# Patient Record
Sex: Male | Born: 1995 | Race: White | Hispanic: No | Marital: Single | Smoking: Never smoker
Health system: Southern US, Community
[De-identification: ages and names within clinical notes are randomized; demographics above are authoritative.]

## PROBLEM LIST (undated history)

## (undated) DIAGNOSIS — R519 Headache, unspecified: Secondary | ICD-10-CM

## (undated) DIAGNOSIS — R51 Headache: Secondary | ICD-10-CM

## (undated) HISTORY — PX: TYMPANOSTOMY TUBE PLACEMENT: SHX32

---
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
## 2018-03-04 NOTE — Discharge Instructions (Signed)
T & A INSTRUCTION SHEET - [facility] °[facility] ° °CREIGHTON VAUGHT, MD °PAUL H. JUENGEL, MD  °P. SCOTT BENNETT °CHAPMAN MCQUEEN, MD ° °[address] [place], [place] [address] TEL. [phone] °[address] [place] [address] [phone] ° °INFORMATION SHEET FOR A TONSILLECTOMY AND ADENDOIDECTOMY ° °About Your Tonsils and Adenoids ° The tonsils and adenoids are normal body tissues that are part of our immune system.  They normally help to protect us against diseases that may enter our mouth and nose.  However, sometimes the tonsils and/or adenoids become too large and obstruct our breathing, especially at night. °  ° If either of these things happen it helps to remove the tonsils and adenoids in order to become healthier. The operation to remove the tonsils and adenoids is called a tonsillectomy and adenoidectomy. ° °The Location of Your Tonsils and Adenoids ° The tonsils are located in the back of the throat on both side and sit in a cradle of muscles. The adenoids are located in the roof of the mouth, behind the nose, and closely associated with the opening of the Eustachian tube to the ear. ° °Surgery on Tonsils and Adenoids ° A tonsillectomy and adenoidectomy is a short operation which takes about thirty minutes.  This includes being put to sleep and being awakened.  Tonsillectomies and adenoidectomies are performed at [facility] and may require observation period in the recovery room prior to going home. ° °Following the Operation for a Tonsillectomy ° A cautery machine is used to control bleeding.  Bleeding from a tonsillectomy and adenoidectomy is minimal and postoperatively the risk of bleeding is approximately four percent, although this rarely life threatening. ° ° ° °After your tonsillectomy and adenoidectomy post-op care at home: ° °1. Our patients are able to go home the same day.  You may be given prescriptions for pain  medications and antibiotics, if indicated. °2. It is extremely important to remember that fluid intake is of utmost importance after a tonsillectomy.  The amount that you drink must be maintained in the postoperative period.  A good indication of whether a child is getting enough fluid is whether his/her urine output is constant.  As long as children are urinating or wetting their diaper every 6 - 8 hours this is usually enough fluid intake.   °3. Although rare, this is a risk of some bleeding in the first ten days after surgery.  This is usually occurs between day five and nine postoperatively.  This risk of bleeding is approximately four percent.  If you or your child should have any bleeding you should remain calm and notify our office or go directly to the Emergency Room at [facility] where they will contact us. Our doctors are available seven days a week for notification.  We recommend sitting up quietly in a chair, place an ice pack on the front of the neck and spitting out the blood gently until we are able to contact you.  Adults should gargle gently with ice water and this may help stop the bleeding.  If the bleeding does not stop after a short time, i.e. 10 to 15 minutes, or seems to be increasing again, please contact us or go to the hospital.   °4. It is common for the pain to be worse at 5 - 7 days postoperatively.  This occurs because the “scab” is peeling off and the mucous membrane (skin of   the throat) is growing back where the tonsils were.   °5. It is common for a low-grade fever, less than 102, during the first week after a tonsillectomy and adenoidectomy.  It is usually due to not drinking enough liquids, and we suggest your use liquid Tylenol or the pain medicine with Tylenol prescribed in order to keep your temperature below 102.  Please follow the directions on the back of the bottle. °6. Do not take aspirin or any products that contain aspirin such as Bufferin, Anacin,  Ecotrin, aspirin gum, Goodies, BC headache powders, etc., after a T&A because it can promote bleeding.  Please check with our office before administering any other medication that may been prescribed by other doctors during the two week post-operative period. °7. If you happen to look in the mirror or into your child’s mouth you will see white/gray patches on the back of the throat.  This is what a scab looks like in the mouth and is normal after having a T&A.  It will disappear once the tonsil area heals completely. However, it may cause a noticeable odor, and this too will disappear with time.     °8. You or your child may experience ear pain after having a T&A.  This is called referred pain and comes from the throat, but it is felt in the ears.  Ear pain is quite common and expected.  It will usually go away after ten days.  There is usually nothing wrong with the ears, and it is primarily due to the healing area stimulating the nerve to the ear that runs along the side of the throat.  Use either the prescribed pain medicine or Tylenol as needed.  °9. The throat tissues after a tonsillectomy are obviously sensitive.  Smoking around children who have had a tonsillectomy significantly increases the risk of bleeding.  DO NOT SMOKE!  ° °General Anesthesia, Adult, Care After °These instructions provide you with information about caring for yourself after your procedure. Your health care provider may also give you more specific instructions. Your treatment has been planned according to current medical practices, but problems sometimes occur. Call your health care provider if you have any problems or questions after your procedure. °What can I expect after the procedure? °After the procedure, it is common to have: °· Vomiting. °· A sore throat. °· Mental slowness. ° °It is common to feel: °· Nauseous. °· Cold or shivery. °· Sleepy. °· Tired. °· Sore or achy, even in parts of your body where you did not have  surgery. ° °Follow these instructions at home: °For at least 24 hours after the procedure: °· Do not: °? Participate in activities where you could fall or become injured. °? Drive. °? Use heavy machinery. °? Drink alcohol. °? Take sleeping pills or medicines that cause drowsiness. °? Make important decisions or sign legal documents. °? Take care of children on your own. °· Rest. °Eating and drinking °· If you vomit, drink water, juice, or soup when you can drink without vomiting. °· Drink enough fluid to keep your urine clear or pale yellow. °· Make sure you have little or no nausea before eating solid foods. °· Follow the diet recommended by your health care provider. °General instructions °· Have a responsible adult stay with you until you are awake and alert. °· Return to your normal activities as told by your health care provider. Ask your health care provider what activities are safe for you. °· Take over-the-counter and   prescription medicines only as told by your health care provider. °· If you smoke, do not smoke without supervision. °· Keep all follow-up visits as told by your health care provider. This is important. °Contact a health care provider if: °· You continue to have nausea or vomiting at home, and medicines are not helpful. °· You cannot drink fluids or start eating again. °· You cannot urinate after 8-12 hours. °· You develop a skin rash. °· You have fever. °· You have increasing redness at the site of your procedure. °Get help right away if: °· You have difficulty breathing. °· You have chest pain. °· You have unexpected bleeding. °· You feel that you are having a life-threatening or urgent problem. °This information is not intended to replace advice given to you by your health care provider. Make sure you discuss any questions you have with your health care provider. °Document Released: 01/27/2001 Document Revised: 03/25/2016 Document Reviewed: 10/05/2015 °Elsevier Interactive Patient Education  © 2018 [employer] ° °

## 2018-03-06 ENCOUNTER — Ambulatory Visit: Payer: Managed Care, Other (non HMO) | Admitting: Anesthesiology

## 2018-03-06 ENCOUNTER — Encounter
Admission: RE | Disposition: A | Discharge status: Home / Self Care | Payer: Self-pay | Source: Ambulatory Visit | Attending: Unknown Physician Specialty

## 2018-03-06 ENCOUNTER — Ambulatory Visit
Admission: RE | Admit: 2018-03-06 | Discharge: 2018-03-06 | Disposition: A | Discharge status: Home / Self Care | Payer: Managed Care, Other (non HMO) | Source: Ambulatory Visit | Attending: Unknown Physician Specialty | Admitting: Unknown Physician Specialty

## 2018-03-06 DIAGNOSIS — J3501 Chronic tonsillitis: Secondary | ICD-10-CM | POA: Insufficient documentation

## 2018-03-06 HISTORY — PX: TONSILLECTOMY AND ADENOIDECTOMY: SHX28

## 2018-03-06 HISTORY — DX: Headache, unspecified: R51.9

## 2018-03-06 HISTORY — DX: Headache: R51

## 2018-03-06 SURGERY — TONSILLECTOMY AND ADENOIDECTOMY
Anesthesia: General | Site: Throat | Laterality: Bilateral | Wound class: "Clean Contaminated "

## 2018-03-06 MED ORDER — ACETAMINOPHEN 10 MG/ML IV SOLN
1000.0000 mg | Freq: Once | INTRAVENOUS | Status: AC
  Administered 2018-03-06: 1000 mg via INTRAVENOUS

## 2018-03-06 MED ORDER — MEPERIDINE HCL 25 MG/ML IJ SOLN: 6.2500 mg | INTRAMUSCULAR | Status: DC | PRN

## 2018-03-06 MED ORDER — HYDROCODONE-ACETAMINOPHEN 7.5-325 MG/15ML PO SOLN: 15.0000 mL | Freq: Four times a day (QID) | ORAL | 0 refills | Status: AC | PRN

## 2018-03-06 MED ORDER — LACTATED RINGERS IV SOLN
10.0000 mL/h | INTRAVENOUS | Status: DC
  Administered 2018-03-06: 10 mL/h via INTRAVENOUS

## 2018-03-06 MED ORDER — PROPOFOL 10 MG/ML IV BOLUS
INTRAVENOUS | Status: DC | PRN
  Administered 2018-03-06: 50 mg via INTRAVENOUS
  Administered 2018-03-06: 300 mg via INTRAVENOUS
  Administered 2018-03-06: 50 mg via INTRAVENOUS

## 2018-03-06 MED ORDER — OXYCODONE HCL 5 MG/5ML PO SOLN
5.0000 mg | Freq: Once | ORAL | Status: AC | PRN
  Administered 2018-03-06: 5 mg via ORAL

## 2018-03-06 MED ORDER — FENTANYL CITRATE (PF) 100 MCG/2ML IJ SOLN
INTRAMUSCULAR | Status: DC | PRN
  Administered 2018-03-06 (×2): 50 ug via INTRAVENOUS

## 2018-03-06 MED ORDER — ONDANSETRON HCL 4 MG/2ML IJ SOLN
INTRAMUSCULAR | Status: DC | PRN
  Administered 2018-03-06: 4 mg via INTRAVENOUS

## 2018-03-06 MED ORDER — OXYCODONE HCL 5 MG PO TABS: 5.0000 mg | ORAL_TABLET | Freq: Once | ORAL | Status: AC | PRN

## 2018-03-06 MED ORDER — LIDOCAINE HCL (CARDIAC) PF 100 MG/5ML IV SOSY
PREFILLED_SYRINGE | INTRAVENOUS | Status: DC | PRN
  Administered 2018-03-06: 50 mg via INTRAVENOUS

## 2018-03-06 MED ORDER — BUPIVACAINE HCL (PF) 0.5 % IJ SOLN
INTRAMUSCULAR | Status: DC | PRN
  Administered 2018-03-06: 8 mL

## 2018-03-06 MED ORDER — PROMETHAZINE HCL 25 MG/ML IJ SOLN: 6.2500 mg | INTRAMUSCULAR | Status: DC | PRN

## 2018-03-06 MED ORDER — SUCCINYLCHOLINE CHLORIDE 20 MG/ML IJ SOLN
INTRAMUSCULAR | Status: DC | PRN
  Administered 2018-03-06: 100 mg via INTRAVENOUS

## 2018-03-06 MED ORDER — FENTANYL CITRATE (PF) 100 MCG/2ML IJ SOLN: 25.0000 ug | INTRAMUSCULAR | Status: DC | PRN

## 2018-03-06 MED ORDER — DEXAMETHASONE SODIUM PHOSPHATE 4 MG/ML IJ SOLN
INTRAMUSCULAR | Status: DC | PRN
  Administered 2018-03-06: 10 mg via INTRAVENOUS

## 2018-03-06 SURGICAL SUPPLY — 23 items
"PENCIL ELECTRO HAND CTR " (MISCELLANEOUS) ×1 IMPLANT
CANISTER SUCT 1200ML W/VALVE (MISCELLANEOUS) ×3 IMPLANT
CATH RUBBER RED 8F (CATHETERS) ×3 IMPLANT
COAG SUCT 10F 3.5MM HAND CTRL (MISCELLANEOUS) ×3 IMPLANT
DRAPE HEAD BAR (DRAPES) ×3 IMPLANT
ELECT CAUTERY BLADE TIP 2.5 (TIP) ×3
ELECT REM PT RETURN 9FT ADLT (ELECTROSURGICAL) ×3
ELECTRODE CAUTERY BLDE TIP 2.5 (TIP) ×1 IMPLANT
ELECTRODE REM PT RTRN 9FT ADLT (ELECTROSURGICAL) ×1 IMPLANT
GLOVE BIO SURGEON STRL SZ7.5 (GLOVE) ×5 IMPLANT
HANDLE SUCTION POOLE (INSTRUMENTS) ×1 IMPLANT
KIT TURNOVER KIT A (KITS) ×3 IMPLANT
NDL HYPO 25GX1X1/2 BEV (NEEDLE) ×1 IMPLANT
NEEDLE HYPO 25GX1X1/2 BEV (NEEDLE) ×3 IMPLANT
NS IRRIG 500ML POUR BTL (IV SOLUTION) ×3 IMPLANT
PACK TONSIL/ADENOIDS (PACKS) ×3 IMPLANT
PENCIL ELECTRO HAND CTR (MISCELLANEOUS) ×3 IMPLANT
SOL ANTI-FOG 6CC FOG-OUT (MISCELLANEOUS) ×1 IMPLANT
SOL FOG-OUT ANTI-FOG 6CC (MISCELLANEOUS) ×2
SPONGE TONSIL 1 RF SGL (DISPOSABLE) ×3 IMPLANT
STRAP BODY AND KNEE 60X3 (MISCELLANEOUS) ×3 IMPLANT
SUCTION POOLE HANDLE (INSTRUMENTS) ×3
SYRINGE 10CC LL (SYRINGE) ×3 IMPLANT

## 2018-03-06 NOTE — Anesthesia Postprocedure Evaluation (Signed)
Anesthesia Post Note  Patient: Seth Wiggins  Procedure(s) Performed: TONSILLECTOMY (Bilateral Throat)  Patient location during evaluation: PACU Anesthesia Type: General Level of consciousness: awake and alert Pain management: pain level controlled Vital Signs Assessment: post-procedure vital signs reviewed and stable Respiratory status: spontaneous breathing, nonlabored ventilation, respiratory function stable and patient connected to nasal cannula oxygen Cardiovascular status: blood pressure returned to baseline and stable Postop Assessment: no apparent nausea or vomiting Anesthetic complications: no    SCOURAS, NICOLE ELAINE

## 2018-03-06 NOTE — Op Note (Signed)
PREOPERATIVE DIAGNOSIS:  TONSIL STONES/ TONSILITIS  POSTOPERATIVE DIAGNOSIS:  Chronic Tonsillitis  OPERATION:  Tonsillectomy.  SURGEON:  Davina Poke, MD  ANESTHESIA:  General endotracheal.  OPERATIVE FINDINGS:  Large tonsils.  DESCRIPTION OF THE PROCEDURE: Seth Wiggins was identified in the holding area and taken to the operating room and placed in the supine position.  After general endotracheal anesthesia, the table was turned 45 degrees and the patient was draped in the usual fashion for a tonsillectomy.  A mouth gag was inserted into the oral cavity.  There were large tonsils.  Beginning on the left-hand side a tenaculum was used to grasp the tonsil and the Bovie cautery was used to dissect it free from the fossa.  In a similar fashion, the right tonsil was removed.  Meticulous hemostasis was achieved using the Bovie cautery.  With both tonsils removed and no active bleeding, 0.5% plain Marcaine was used to inject the anterior and posterior tonsillar pillars bilaterally.  A total of 8ml was used.  The patient tolerated the procedure well and was awakened in the operating room and taken to the recovery room in stable condition.   CULTURES:  None.  SPECIMENS:  Tonsils.  ESTIMATED BLOOD LOSS:  Less than 10 ml.  Davina Poke  03/06/2018  10:08 AM

## 2018-03-06 NOTE — Transfer of Care (Signed)
Immediate Anesthesia Transfer of Care Note  Patient: Seth Wiggins  Procedure(s) Performed: TONSILLECTOMY (Bilateral Throat)  Patient Location: PACU  Anesthesia Type: General ETT  Level of Consciousness: awake, alert  and patient cooperative  Airway and Oxygen Therapy: Patient Spontanous Breathing and Patient connected to supplemental oxygen  Post-op Assessment: Post-op Vital signs reviewed, Patient's Cardiovascular Status Stable, Respiratory Function Stable, Patent Airway and No signs of Nausea or vomiting  Post-op Vital Signs: Reviewed and stable  Complications: No apparent anesthesia complications

## 2018-03-06 NOTE — H&P (Signed)
The patient's history has been reviewed, patient examined, no change in status, stable for surgery.  Questions were answered to the patients satisfaction.  

## 2018-03-06 NOTE — Op Note (Signed)
PREOPERATIVE DIAGNOSIS:  TONSIL STONES/ TONSILITIS  POSTOPERATIVE DIAGNOSIS:  Chronic Tonsillitis  OPERATION:  Tonsillectomy.  SURGEON:  Davina Poke, MD  ANESTHESIA:  General endotracheal.  OPERATIVE FINDINGS:  Large tonsils.  Examination nasopharynx showed no significant adenoid tissue  DESCRIPTION OF THE PROCEDURE: Seth Wiggins was identified in the holding area and taken to the operating room and placed in the supine position.  After general endotracheal anesthesia, the table was turned 45 degrees and the patient was draped in the usual fashion for a tonsillectomy.  A mouth gag was inserted into the oral cavity.  There were large tonsils.  Beginning on the left-hand side a tenaculum was used to grasp the tonsil and the Bovie cautery was used to dissect it free from the fossa.  In a similar fashion, the right tonsil was removed.  Meticulous hemostasis was achieved using the Bovie cautery.  With both tonsils removed and no active bleeding, 0.5% plain Marcaine was used to inject the anterior and posterior tonsillar pillars bilaterally.  A total of 8ml was used.  The patient tolerated the procedure well and was awakened in the operating room and taken to the recovery room in stable condition.   CULTURES:  None.  SPECIMENS:  Tonsils.  ESTIMATED BLOOD LOSS:  Less than 10 ml.  Davina Poke  03/06/2018  10:09 AM

## 2018-03-06 NOTE — Anesthesia Preprocedure Evaluation (Signed)
Anesthesia Evaluation  Patient identified by MRN, date of birth, ID band Patient awake    Reviewed: Allergy & Precautions, H&P , NPO status , Patient's Chart, lab work & pertinent test results, reviewed documented beta blocker date and time   Airway Mallampati: II  TM Distance: >3 FB Neck ROM: full    Dental no notable dental hx.    Pulmonary neg pulmonary ROS,    Pulmonary exam normal breath sounds clear to auscultation       Cardiovascular Exercise Tolerance: Good negative cardio ROS   Rhythm:regular Rate:Normal     Neuro/Psych  Headaches, negative psych ROS   GI/Hepatic negative GI ROS, Neg liver ROS,   Endo/Other  negative endocrine ROS  Renal/GU negative Renal ROS  negative genitourinary   Musculoskeletal   Abdominal   Peds  Hematology negative hematology ROS (+)   Anesthesia Other Findings   Reproductive/Obstetrics negative OB ROS                             Anesthesia Physical Anesthesia Plan  ASA: II  Anesthesia Plan: General ETT   Post-op Pain Management:    Induction:   PONV Risk Score and Plan:   Airway Management Planned:   Additional Equipment:   Intra-op Plan:   Post-operative Plan:   Informed Consent: I have reviewed the patients History and Physical, chart, labs and discussed the procedure including the risks, benefits and alternatives for the proposed anesthesia with the patient or authorized representative who has indicated his/her understanding and acceptance.   Dental Advisory Given  Plan Discussed with: CRNA  Anesthesia Plan Comments:         Anesthesia Quick Evaluation

## 2018-03-06 NOTE — Anesthesia Procedure Notes (Signed)
Procedure Name: Intubation Date/Time: 03/06/2018 9:50 AM Performed by: Janna Arch, CRNA Pre-anesthesia Checklist: Patient identified, Emergency Drugs available, Suction available and Patient being monitored Patient Re-evaluated:Patient Re-evaluated prior to induction Oxygen Delivery Method: Circle system utilized Preoxygenation: Pre-oxygenation with 100% oxygen Induction Type: IV induction Ventilation: Mask ventilation without difficulty Laryngoscope Size: Mac and 4 Grade View: Grade III Tube type: Oral Rae Number of attempts: 2 (bougie) Airway Equipment and Method: Bougie stylet Placement Confirmation: ETT inserted through vocal cords under direct vision,  positive ETCO2 and breath sounds checked- equal and bilateral Tube secured with: Tape Dental Injury: Teeth and Oropharynx as per pre-operative assessment

## 2018-03-09 ENCOUNTER — Encounter: Payer: Self-pay | Admitting: Unknown Physician Specialty

## 2018-03-10 LAB — SURGICAL PATHOLOGY
